# Patient Record
Sex: Female | Born: 1950 | Race: White | Hispanic: No | Marital: Married | Smoking: Never smoker
Health system: Southern US, Community
[De-identification: ages and names within clinical notes are randomized; demographics above are authoritative.]

## PROBLEM LIST (undated history)

## (undated) DIAGNOSIS — F32A Depression, unspecified: Secondary | ICD-10-CM

## (undated) DIAGNOSIS — Z9889 Other specified postprocedural states: Secondary | ICD-10-CM

## (undated) DIAGNOSIS — M533 Sacrococcygeal disorders, not elsewhere classified: Secondary | ICD-10-CM

## (undated) DIAGNOSIS — F419 Anxiety disorder, unspecified: Secondary | ICD-10-CM

## (undated) DIAGNOSIS — R112 Nausea with vomiting, unspecified: Secondary | ICD-10-CM

## (undated) DIAGNOSIS — M797 Fibromyalgia: Secondary | ICD-10-CM

## (undated) DIAGNOSIS — F329 Major depressive disorder, single episode, unspecified: Secondary | ICD-10-CM

## (undated) DIAGNOSIS — K5792 Diverticulitis of intestine, part unspecified, without perforation or abscess without bleeding: Secondary | ICD-10-CM

## (undated) DIAGNOSIS — M1990 Unspecified osteoarthritis, unspecified site: Secondary | ICD-10-CM

## (undated) DIAGNOSIS — J189 Pneumonia, unspecified organism: Secondary | ICD-10-CM

## (undated) DIAGNOSIS — K219 Gastro-esophageal reflux disease without esophagitis: Secondary | ICD-10-CM

## (undated) DIAGNOSIS — D649 Anemia, unspecified: Secondary | ICD-10-CM

## (undated) DIAGNOSIS — J45909 Unspecified asthma, uncomplicated: Secondary | ICD-10-CM

## (undated) HISTORY — PX: TONSILLECTOMY: SUR1361

## (undated) HISTORY — PX: FRACTURE SURGERY: SHX138

## (undated) HISTORY — PX: ESOPHAGOGASTRODUODENOSCOPY: SHX1529

## (undated) HISTORY — PX: ABDOMINAL HYSTERECTOMY: SHX81

## (undated) HISTORY — PX: COLONOSCOPY: SHX174

## (undated) HISTORY — PX: BREAST SURGERY: SHX581

---

## 2015-01-09 ENCOUNTER — Other Ambulatory Visit: Payer: Self-pay | Admitting: Orthopedic Surgery

## 2015-01-17 ENCOUNTER — Other Ambulatory Visit (HOSPITAL_COMMUNITY): Payer: Self-pay

## 2015-01-18 ENCOUNTER — Encounter (HOSPITAL_COMMUNITY): Payer: Self-pay | Admitting: *Deleted

## 2015-01-18 NOTE — Progress Notes (Signed)
Pt denies SOB, chest pain, and being under the care of a cardiologist. Pt denies having a stress test, echo and cardiac cath. Pt denies having an EKG within the last year but stated that PCP, Dr[facility]Ted Nifong of [facility] [place],  performed a chest x ray; records requested. Pt made aware to stop Asprin, NSAID's, otc vitamins, fish oil and herbal medications. Pt verbalized understanding of all pre-op instructions.

## 2015-01-19 ENCOUNTER — Encounter (HOSPITAL_COMMUNITY): Payer: Self-pay | Admitting: *Deleted

## 2015-01-19 ENCOUNTER — Encounter (HOSPITAL_COMMUNITY)
Admission: RE | Disposition: A | Discharge status: Home / Self Care | Payer: Self-pay | Source: Ambulatory Visit | Attending: Orthopedic Surgery

## 2015-01-19 ENCOUNTER — Ambulatory Visit (HOSPITAL_COMMUNITY): Payer: Medicare HMO

## 2015-01-19 ENCOUNTER — Ambulatory Visit (HOSPITAL_COMMUNITY)
Admission: RE | Admit: 2015-01-19 | Discharge: 2015-01-19 | Disposition: A | Discharge status: Home / Self Care | Payer: Medicare HMO | Source: Ambulatory Visit | Attending: Orthopedic Surgery | Admitting: Orthopedic Surgery

## 2015-01-19 ENCOUNTER — Ambulatory Visit (HOSPITAL_COMMUNITY): Payer: Medicare HMO | Admitting: Anesthesiology

## 2015-01-19 DIAGNOSIS — F329 Major depressive disorder, single episode, unspecified: Secondary | ICD-10-CM | POA: Insufficient documentation

## 2015-01-19 DIAGNOSIS — M1990 Unspecified osteoarthritis, unspecified site: Secondary | ICD-10-CM | POA: Diagnosis not present

## 2015-01-19 DIAGNOSIS — Z01818 Encounter for other preprocedural examination: Secondary | ICD-10-CM

## 2015-01-19 DIAGNOSIS — M545 Low back pain: Secondary | ICD-10-CM | POA: Diagnosis present

## 2015-01-19 DIAGNOSIS — F413 Other mixed anxiety disorders: Secondary | ICD-10-CM | POA: Insufficient documentation

## 2015-01-19 DIAGNOSIS — Z419 Encounter for procedure for purposes other than remedying health state, unspecified: Secondary | ICD-10-CM

## 2015-01-19 DIAGNOSIS — M797 Fibromyalgia: Secondary | ICD-10-CM | POA: Insufficient documentation

## 2015-01-19 DIAGNOSIS — J45909 Unspecified asthma, uncomplicated: Secondary | ICD-10-CM | POA: Insufficient documentation

## 2015-01-19 HISTORY — DX: Anxiety disorder, unspecified: F41.9

## 2015-01-19 HISTORY — DX: Sacrococcygeal disorders, not elsewhere classified: M53.3

## 2015-01-19 HISTORY — DX: Diverticulitis of intestine, part unspecified, without perforation or abscess without bleeding: K57.92

## 2015-01-19 HISTORY — DX: Unspecified asthma, uncomplicated: J45.909

## 2015-01-19 HISTORY — DX: Unspecified osteoarthritis, unspecified site: M19.90

## 2015-01-19 HISTORY — DX: Major depressive disorder, single episode, unspecified: F32.9

## 2015-01-19 HISTORY — DX: Depression, unspecified: F32.A

## 2015-01-19 HISTORY — DX: Gastro-esophageal reflux disease without esophagitis: K21.9

## 2015-01-19 HISTORY — PX: SACROILIAC JOINT FUSION: SHX6088

## 2015-01-19 HISTORY — DX: Anemia, unspecified: D64.9

## 2015-01-19 HISTORY — DX: Other specified postprocedural states: Z98.890

## 2015-01-19 HISTORY — DX: Fibromyalgia: M79.7

## 2015-01-19 HISTORY — DX: Nausea with vomiting, unspecified: R11.2

## 2015-01-19 HISTORY — DX: Pneumonia, unspecified organism: J18.9

## 2015-01-19 LAB — CBC WITH DIFFERENTIAL/PLATELET
Basophils Absolute: 0 10*3/uL (ref 0.0–0.1)
Basophils Relative: 1 %
EOS ABS: 0.2 10*3/uL (ref 0.0–0.7)
EOS PCT: 2 %
HCT: 42 % (ref 36.0–46.0)
HEMOGLOBIN: 13.5 g/dL (ref 12.0–15.0)
LYMPHS ABS: 1.7 10*3/uL (ref 0.7–4.0)
Lymphocytes Relative: 24 %
MCH: 29.9 pg (ref 26.0–34.0)
MCHC: 32.1 g/dL (ref 30.0–36.0)
MCV: 92.9 fL (ref 78.0–100.0)
MONOS PCT: 7 %
Monocytes Absolute: 0.5 10*3/uL (ref 0.1–1.0)
NEUTROS PCT: 66 %
Neutro Abs: 4.9 10*3/uL (ref 1.7–7.7)
Platelets: 406 10*3/uL — ABNORMAL HIGH (ref 150–400)
RBC: 4.52 MIL/uL (ref 3.87–5.11)
RDW: 14.2 % (ref 11.5–15.5)
WBC: 7.3 10*3/uL (ref 4.0–10.5)

## 2015-01-19 LAB — COMPREHENSIVE METABOLIC PANEL
ALK PHOS: 80 U/L (ref 38–126)
ALT: 20 U/L (ref 14–54)
ANION GAP: 9 (ref 5–15)
AST: 20 U/L (ref 15–41)
Albumin: 4 g/dL (ref 3.5–5.0)
BUN: 9 mg/dL (ref 6–20)
CALCIUM: 9.7 mg/dL (ref 8.9–10.3)
CO2: 26 mmol/L (ref 22–32)
Chloride: 107 mmol/L (ref 101–111)
Creatinine, Ser: 0.91 mg/dL (ref 0.44–1.00)
Glucose, Bld: 106 mg/dL — ABNORMAL HIGH (ref 65–99)
Potassium: 4 mmol/L (ref 3.5–5.1)
SODIUM: 142 mmol/L (ref 135–145)
TOTAL PROTEIN: 7.3 g/dL (ref 6.5–8.1)
Total Bilirubin: 0.5 mg/dL (ref 0.3–1.2)

## 2015-01-19 LAB — APTT: aPTT: 32 seconds (ref 24–37)

## 2015-01-19 LAB — SURGICAL PCR SCREEN
MRSA, PCR: NEGATIVE
Staphylococcus aureus: POSITIVE — AB

## 2015-01-19 LAB — PROTIME-INR
INR: 1.11 (ref 0.00–1.49)
PROTHROMBIN TIME: 14.5 s (ref 11.6–15.2)

## 2015-01-19 SURGERY — SACROILIAC JOINT FUSION
Anesthesia: General | Site: Pelvis | Laterality: Left

## 2015-01-19 MED ORDER — BUPIVACAINE-EPINEPHRINE (PF) 0.25% -1:200000 IJ SOLN
INTRAMUSCULAR | Status: DC | PRN
  Administered 2015-01-19: 10 mL

## 2015-01-19 MED ORDER — CEFAZOLIN SODIUM-DEXTROSE 2-3 GM-% IV SOLR
2.0000 g | INTRAVENOUS | Status: AC
  Administered 2015-01-19: 2 g via INTRAVENOUS
  Filled 2015-01-19: qty 50

## 2015-01-19 MED ORDER — MIDAZOLAM HCL 5 MG/5ML IJ SOLN
INTRAMUSCULAR | Status: DC | PRN
  Administered 2015-01-19: 2 mg via INTRAVENOUS

## 2015-01-19 MED ORDER — PROPOFOL 10 MG/ML IV BOLUS
INTRAVENOUS | Status: DC | PRN
  Administered 2015-01-19: 150 mg via INTRAVENOUS

## 2015-01-19 MED ORDER — METHOCARBAMOL 1000 MG/10ML IJ SOLN
500.0000 mg | Freq: Three times a day (TID) | INTRAMUSCULAR | Status: DC
Start: 2015-01-19 — End: 2015-01-19
  Filled 2015-01-19: qty 5

## 2015-01-19 MED ORDER — DIAZEPAM 5 MG PO TABS
ORAL_TABLET | ORAL | Status: AC
  Filled 2015-01-19: qty 1

## 2015-01-19 MED ORDER — ONDANSETRON HCL 4 MG/2ML IJ SOLN
INTRAMUSCULAR | Status: DC | PRN
  Administered 2015-01-19: 4 mg via INTRAVENOUS

## 2015-01-19 MED ORDER — LACTATED RINGERS IV SOLN
INTRAVENOUS | Status: DC | PRN
  Administered 2015-01-19 (×2): via INTRAVENOUS

## 2015-01-19 MED ORDER — ROCURONIUM BROMIDE 50 MG/5ML IV SOLN
INTRAVENOUS | Status: AC
  Filled 2015-01-19: qty 1

## 2015-01-19 MED ORDER — HYDROMORPHONE HCL 1 MG/ML IJ SOLN
0.2500 mg | INTRAMUSCULAR | Status: DC | PRN
  Administered 2015-01-19 (×4): 0.5 mg via INTRAVENOUS

## 2015-01-19 MED ORDER — MUPIROCIN 2 % EX OINT
1.0000 "application " | TOPICAL_OINTMENT | Freq: Once | CUTANEOUS | Status: AC
  Administered 2015-01-19: 1 via TOPICAL
  Filled 2015-01-19: qty 22

## 2015-01-19 MED ORDER — FENTANYL CITRATE (PF) 100 MCG/2ML IJ SOLN
INTRAMUSCULAR | Status: AC
  Administered 2015-01-19: 100 ug via INTRAVENOUS
  Filled 2015-01-19: qty 2

## 2015-01-19 MED ORDER — 0.9 % SODIUM CHLORIDE (POUR BTL) OPTIME
TOPICAL | Status: DC | PRN
  Administered 2015-01-19: 1000 mL

## 2015-01-19 MED ORDER — FENTANYL CITRATE (PF) 250 MCG/5ML IJ SOLN
INTRAMUSCULAR | Status: AC
  Filled 2015-01-19: qty 5

## 2015-01-19 MED ORDER — EPHEDRINE SULFATE 50 MG/ML IJ SOLN
INTRAMUSCULAR | Status: AC
  Filled 2015-01-19: qty 1

## 2015-01-19 MED ORDER — HYDROMORPHONE HCL 1 MG/ML IJ SOLN
INTRAMUSCULAR | Status: AC
  Filled 2015-01-19: qty 1

## 2015-01-19 MED ORDER — LACTATED RINGERS IV SOLN
INTRAVENOUS | Status: DC
  Administered 2015-01-19: 11:00:00 via INTRAVENOUS

## 2015-01-19 MED ORDER — BUPIVACAINE-EPINEPHRINE (PF) 0.25% -1:200000 IJ SOLN
INTRAMUSCULAR | Status: AC
  Filled 2015-01-19: qty 30

## 2015-01-19 MED ORDER — MIDAZOLAM HCL 2 MG/2ML IJ SOLN
INTRAMUSCULAR | Status: AC
Start: 2015-01-19 — End: 2015-01-19
  Filled 2015-01-19: qty 2

## 2015-01-19 MED ORDER — LIDOCAINE HCL (CARDIAC) 20 MG/ML IV SOLN
INTRAVENOUS | Status: AC
  Filled 2015-01-19: qty 5

## 2015-01-19 MED ORDER — FENTANYL CITRATE (PF) 100 MCG/2ML IJ SOLN
INTRAMUSCULAR | Status: DC | PRN
  Administered 2015-01-19 (×2): 50 ug via INTRAVENOUS
  Administered 2015-01-19: 100 ug via INTRAVENOUS
  Administered 2015-01-19: 50 ug via INTRAVENOUS

## 2015-01-19 MED ORDER — POVIDONE-IODINE 7.5 % EX SOLN
Freq: Once | CUTANEOUS | Status: DC
  Filled 2015-01-19: qty 118

## 2015-01-19 MED ORDER — ROCURONIUM BROMIDE 100 MG/10ML IV SOLN
INTRAVENOUS | Status: DC | PRN
  Administered 2015-01-19: 50 mg via INTRAVENOUS

## 2015-01-19 MED ORDER — STERILE WATER FOR INJECTION IJ SOLN
INTRAMUSCULAR | Status: AC
  Filled 2015-01-19: qty 10

## 2015-01-19 MED ORDER — FENTANYL CITRATE (PF) 100 MCG/2ML IJ SOLN
100.0000 ug | Freq: Once | INTRAMUSCULAR | Status: AC
  Administered 2015-01-19: 100 ug via INTRAVENOUS

## 2015-01-19 MED ORDER — METHOCARBAMOL 1000 MG/10ML IJ SOLN
500.0000 mg | INTRAVENOUS | Status: AC
  Administered 2015-01-19: 500 mg via INTRAVENOUS
  Filled 2015-01-19: qty 5

## 2015-01-19 MED ORDER — GLYCOPYRROLATE 0.2 MG/ML IJ SOLN
INTRAMUSCULAR | Status: AC
  Filled 2015-01-19: qty 1

## 2015-01-19 MED ORDER — LIDOCAINE HCL (CARDIAC) 20 MG/ML IV SOLN
INTRAVENOUS | Status: DC | PRN
  Administered 2015-01-19: 60 mg via INTRAVENOUS

## 2015-01-19 MED ORDER — PHENYLEPHRINE 40 MCG/ML (10ML) SYRINGE FOR IV PUSH (FOR BLOOD PRESSURE SUPPORT)
PREFILLED_SYRINGE | INTRAVENOUS | Status: AC
  Filled 2015-01-19: qty 10

## 2015-01-19 MED ORDER — PHENYLEPHRINE HCL 10 MG/ML IJ SOLN
INTRAMUSCULAR | Status: DC | PRN
  Administered 2015-01-19 (×2): 80 ug via INTRAVENOUS

## 2015-01-19 MED ORDER — SUGAMMADEX SODIUM 200 MG/2ML IV SOLN
INTRAVENOUS | Status: DC | PRN
  Administered 2015-01-19: 214 mg via INTRAVENOUS

## 2015-01-19 MED ORDER — SUGAMMADEX SODIUM 500 MG/5ML IV SOLN
INTRAVENOUS | Status: AC
  Filled 2015-01-19: qty 5

## 2015-01-19 MED ORDER — DIAZEPAM 5 MG PO TABS
5.0000 mg | ORAL_TABLET | Freq: Once | ORAL | Status: AC
  Administered 2015-01-19: 5 mg via ORAL

## 2015-01-19 SURGICAL SUPPLY — 54 items
BENZOIN TINCTURE PRP APPL 2/3 (GAUZE/BANDAGES/DRESSINGS) ×3 IMPLANT
BLADE SURG 10 STRL SS (BLADE) ×3 IMPLANT
BLADE SURG 11 STRL SS (BLADE) ×3 IMPLANT
BLADE SURG ROTATE 9660 (MISCELLANEOUS) IMPLANT
CANISTER SUCTION 2500CC (MISCELLANEOUS) ×3 IMPLANT
CAP-I-FUSE IMPLANT SYSTEM ×3 IMPLANT
CLOSURE WOUND 1/2 X4 (GAUZE/BANDAGES/DRESSINGS) ×1
COVER SURGICAL LIGHT HANDLE (MISCELLANEOUS) ×3 IMPLANT
DRAPE C-ARM 42X72 X-RAY (DRAPES) ×3 IMPLANT
DRAPE C-ARMOR (DRAPES) ×3 IMPLANT
DRAPE INCISE IOBAN 66X45 STRL (DRAPES) ×3 IMPLANT
DRAPE POUCH INSTRU U-SHP 10X18 (DRAPES) ×3 IMPLANT
DRAPE SURG 17X23 STRL (DRAPES) ×12 IMPLANT
DURAPREP 26ML APPLICATOR (WOUND CARE) ×3 IMPLANT
ELECT CAUTERY BLADE 6.4 (BLADE) ×3 IMPLANT
ELECT REM PT RETURN 9FT ADLT (ELECTROSURGICAL) ×3
ELECTRODE REM PT RTRN 9FT ADLT (ELECTROSURGICAL) ×1 IMPLANT
GAUZE SPONGE 4X4 12PLY STRL (GAUZE/BANDAGES/DRESSINGS) ×3 IMPLANT
GAUZE SPONGE 4X4 16PLY XRAY LF (GAUZE/BANDAGES/DRESSINGS) ×3 IMPLANT
GLOVE BIO SURGEON STRL SZ7 (GLOVE) ×3 IMPLANT
GLOVE BIO SURGEON STRL SZ8 (GLOVE) ×3 IMPLANT
GLOVE BIOGEL PI IND STRL 7.0 (GLOVE) ×1 IMPLANT
GLOVE BIOGEL PI IND STRL 8 (GLOVE) ×1 IMPLANT
GLOVE BIOGEL PI INDICATOR 7.0 (GLOVE) ×2
GLOVE BIOGEL PI INDICATOR 8 (GLOVE) ×2
GOWN STRL REUS W/ TWL LRG LVL3 (GOWN DISPOSABLE) ×2 IMPLANT
GOWN STRL REUS W/ TWL XL LVL3 (GOWN DISPOSABLE) ×1 IMPLANT
GOWN STRL REUS W/TWL LRG LVL3 (GOWN DISPOSABLE) ×4
GOWN STRL REUS W/TWL XL LVL3 (GOWN DISPOSABLE) ×2
KIT BASIN OR (CUSTOM PROCEDURE TRAY) ×3 IMPLANT
KIT ROOM TURNOVER OR (KITS) ×3 IMPLANT
MANIFOLD NEPTUNE II (INSTRUMENTS) IMPLANT
NEEDLE 22X1 1/2 (OR ONLY) (NEEDLE) ×3 IMPLANT
NEEDLE HYPO 25GX1X1/2 BEV (NEEDLE) ×3 IMPLANT
NS IRRIG 1000ML POUR BTL (IV SOLUTION) ×3 IMPLANT
PACK UNIVERSAL I (CUSTOM PROCEDURE TRAY) ×6 IMPLANT
PAD ARMBOARD 7.5X6 YLW CONV (MISCELLANEOUS) ×9 IMPLANT
PENCIL BUTTON HOLSTER BLD 10FT (ELECTRODE) ×3 IMPLANT
SPONGE LAP 18X18 X RAY DECT (DISPOSABLE) ×3 IMPLANT
STAPLER VISISTAT 35W (STAPLE) ×3 IMPLANT
STRIP CLOSURE SKIN 1/2X4 (GAUZE/BANDAGES/DRESSINGS) ×2 IMPLANT
SUT MNCRL AB 4-0 PS2 18 (SUTURE) ×3 IMPLANT
SUT VIC AB 0 CT1 18XCR BRD 8 (SUTURE) ×1 IMPLANT
SUT VIC AB 0 CT1 8-18 (SUTURE) ×2
SUT VIC AB 1 CT1 18XCR BRD 8 (SUTURE) ×1 IMPLANT
SUT VIC AB 1 CT1 8-18 (SUTURE) ×2
SUT VIC AB 2-0 CT2 18 VCP726D (SUTURE) ×3 IMPLANT
SYR BULB IRRIGATION 50ML (SYRINGE) ×6 IMPLANT
SYR CONTROL 10ML LL (SYRINGE) ×3 IMPLANT
TOWEL OR 17X24 6PK STRL BLUE (TOWEL DISPOSABLE) IMPLANT
TOWEL OR 17X26 10 PK STRL BLUE (TOWEL DISPOSABLE) ×6 IMPLANT
TUBE CONNECTING 12'X1/4 (SUCTIONS) ×1
TUBE CONNECTING 12X1/4 (SUCTIONS) ×2 IMPLANT
YANKAUER SUCT BULB TIP NO VENT (SUCTIONS) ×3 IMPLANT

## 2015-01-19 NOTE — Op Note (Signed)
NAMWilla Graham:  [place]           ACCOUNT NO.:  [id number]  MEDICAL RECORD NO.:  [id number]  LOCATION:  [facility]                         FACILITY:  [facility]  PHYSICIAN:  Estill BambergMark Reathel Turi, MD      DATE OF BIRTH:  1950/10/27  DATE OF PROCEDURE:  01/19/2015 DATE OF DISCHARGE:  01/19/2015                              OPERATIVE REPORT   PREOPERATIVE DIAGNOSIS:  Left-sided sacroiliac joint dysfunction.  POSTOPERATIVE DIAGNOSIS:  Left-sided sacroiliac joint dysfunction.  PROCEDURE:  Left-sided sacroiliac joint fusion using iFuse instrumentation system.  SURGEON:  Estill BambergMark Verdean Murin, MD.  ASSISTANJason Coop:  Kayla McKenzie, PA-C.  ANESTHESIA:  General endotracheal anesthesia.  COMPLICATIONS:  None.  DISPOSITION:  Stable.  ESTIMATED BLOOD LOSS:  Minimal.  INDICATIONS FOR SURGERY:  Briefly, Ms. Maravilla is a very pleasant 89- year-old female, who did present to me with ongoing and debilitating pain at the left side of her low back.  The patient did fail appropriate forms of conservative care as outlined in my preoperative note.  The patient however did continue to have ongoing pain in the left low back consistent with left sacroiliac joint dysfunction.  Therefore, we did discuss proceeding with the procedure reflected above.  The patient was fully aware of the risks and limitations of the procedure, and she did elect to proceed.  OPERATIVE DETAILS:  On January 19, 2015, the patient was brought to Surgery and general endotracheal anesthesia was administered.  The patient was placed into the prone position.  Gel rolls were placed under the patient's chest and hips.  Antibiotics were given and the left buttock was prepped and draped in the usual fashion.  An incision was then made overlying the left sacroiliac joint liberally using inlet, outlet, and lateral fluoroscopy, I did advance 3 guidewires across the left SI joint.  One was immediately above the S1 foramen, one was beneath it, and the third  was in line with the S2 foramen.  I then drilled and broached over the guidewires.  I then advanced 7 mm implants of the appropriate length across the guidewires, again, liberally using inlet, outlet, and lateral fluoroscopic images.  I was very pleased with the final resting position of the implants.  The guidewires were then removed.  I then copiously irrigated the wound with approximately 500 mL of normal saline.  The wound was then closed in layers using #1 Vicryl followed by 0-Vicryl, followed by 2-0 Vicryl, followed by 3-0 Monocryl. Benzoin and Steri-Strips were applied, followed by sterile dressing. All instrument counts were correct at the termination of the procedure.  Of note, Jason CoopKayla McKenzie was my assistant throughout surgery, and did aid in retraction, suctioning, and closure from start to finish.     Estill BambergMark Patriciaann Rabanal, MD     MD/MEDQ  D:  01/19/2015  T:  01/19/2015  Job:  [id number]

## 2015-01-19 NOTE — H&P (Signed)
PREOPERATIVE H&P  Chief Complaint: Left low back pain  HPI: Katie Graham is a 64 y.o. female who presents with ongoing pain in the left low back  Patient had an appropriate response to MBBs and an RFA, but her relief was only very temporary  Patient has failed multiple forms of conservative care and continues to have pain (see office notes for additional details regarding the patient's full course of treatment)  Past Medical History  Diagnosis Date  . PONV (postoperative nausea and vomiting)     with Sodium Penothal  . Asthma   . Sacroiliac joint dysfunction of left side   . Arthritis   . Fibromyalgia   . GERD (gastroesophageal reflux disease)   . Pneumonia   . Depression   . Anxiety   . Anemia   . Diverticulitis    Past Surgical History  Procedure Laterality Date  . Fracture surgery      right shoulder  . Esophagogastroduodenoscopy    . Colonoscopy    . Tonsillectomy    . Abdominal hysterectomy    . Breast surgery      lumpectomy 1985 left breast   Social History   Social History  . Marital Status: Married    Spouse Name: N/A  . Number of Children: N/A  . Years of Education: N/A   Social History Main Topics  . Smoking status: Never Smoker   . Smokeless tobacco: Never Used  . Alcohol Use: Yes     Comment: social  . Drug Use: No  . Sexual Activity: Not Asked   Other Topics Concern  . None   Social History Narrative  . None   Family History  Problem Relation Age of Onset  . Cardiomyopathy Sister   . Cancer Sister    Allergies  Allergen Reactions  . Nsaids     GURD and diverticulitis    Prior to Admission medications   Medication Sig Start Date End Date Taking? Authorizing Provider  albuterol (PROVENTIL HFA;VENTOLIN HFA) 108 (90 BASE) MCG/ACT inhaler Inhale 1-2 puffs into the lungs every 6 (six) hours as needed for wheezing or shortness of breath.   Yes Historical Provider, MD  cyclobenzaprine (FLEXERIL) 10 MG tablet Take 10 mg by  mouth 3 (three) times daily as needed for muscle spasms.   Yes Historical Provider, MD  Fluticasone-Salmeterol (ADVAIR) 250-50 MCG/DOSE AEPB Inhale 1 puff into the lungs 2 (two) times daily.   Yes Historical Provider, MD  gabapentin (NEURONTIN) 300 MG capsule Take 300 mg by mouth 3 (three) times daily.   Yes Historical Provider, MD  oxyCODONE-acetaminophen (PERCOCET) 10-325 MG tablet Take 1 tablet by mouth every 4 (four) hours as needed for pain.   Yes Historical Provider, MD  sertraline (ZOLOFT) 100 MG tablet Take 100 mg by mouth daily.   Yes Historical Provider, MD     All other systems have been reviewed and were otherwise negative with the exception of those mentioned in the HPI and as above.  Physical Exam: There were no vitals filed for this visit.  General: Alert, no acute distress Cardiovascular: No pedal edema Respiratory: No cyanosis, no use of accessory musculature Skin: No lesions in the area of chief complaint Neurologic: Sensation intact distally Psychiatric: Patient is competent for consent with normal mood and affect Lymphatic: No axillary or cervical lymphadenopathy  MUSCULOSKELETAL: + TTP left SI joint  Assessment/Plan: Left sided sacroiliac joint dysfunction Plan for Procedure(s):  LEFT SIDED SACROILIAC JOINT FUSION    Katryna Tschirhart  Darcel Bayley, MD 01/19/2015 6:59 AM

## 2015-01-19 NOTE — Transfer of Care (Signed)
Immediate Anesthesia Transfer of Care Note  Patient: Katie FraterBeverly Grinage  Procedure(s) Performed: Procedure(s):  LEFT SIDED SACROILIAC JOINT FUSION  (Left)  Patient Location: PACU  Anesthesia Type:General  Level of Consciousness: awake and alert   Airway & Oxygen Therapy: Patient Spontanous Breathing and Patient connected to nasal cannula oxygen  Post-op Assessment: Report given to RN and Post -op Vital signs reviewed and stable  Post vital signs: Reviewed and stable  Last Vitals:  Filed Vitals:   01/19/15 0949  BP: 172/97  Pulse: 88  Temp: 36.8 C  Resp: 18    Complications: No apparent anesthesia complications

## 2015-01-19 NOTE — Anesthesia Procedure Notes (Signed)
Procedure Name: Intubation Date/Time: 01/19/2015 1:02 PM Performed by: Eligha Bridegroom Pre-anesthesia Checklist: Patient identified, Patient being monitored, Emergency Drugs available, Timeout performed and Suction available Patient Re-evaluated:Patient Re-evaluated prior to inductionOxygen Delivery Method: Circle system utilized Preoxygenation: Pre-oxygenation with 100% oxygen Intubation Type: IV induction Ventilation: Mask ventilation without difficulty and Oral airway inserted - appropriate to patient size Grade View: Grade II Tube type: Oral Tube size: 7.0 mm Number of attempts: 1 Airway Equipment and Method: Stylet,  LTA kit utilized and Oral airway Secured at: 22 cm Tube secured with: Tape Dental Injury: Teeth and Oropharynx as per pre-operative assessment

## 2015-01-19 NOTE — Anesthesia Preprocedure Evaluation (Addendum)
Anesthesia Evaluation  Patient identified by MRN, date of birth, ID band Patient awake    Reviewed: Allergy & Precautions, H&P , NPO status , Patient's Chart, lab work & pertinent test results  History of Anesthesia Complications (+) PONV  Airway Mallampati: III  TM Distance: >3 FB Neck ROM: Full    Dental no notable dental hx. (+) Teeth Intact, Dental Advisory Given   Pulmonary asthma ,    Pulmonary exam normal breath sounds clear to auscultation       Cardiovascular negative cardio ROS   Rhythm:Regular Rate:Normal     Neuro/Psych Anxiety Depression negative neurological ROS     GI/Hepatic Neg liver ROS, GERD  Controlled,  Endo/Other  Morbid obesity  Renal/GU negative Renal ROS  negative genitourinary   Musculoskeletal  (+) Arthritis , Osteoarthritis,  Fibromyalgia -  Abdominal   Peds  Hematology negative hematology ROS (+)   Anesthesia Other Findings   Reproductive/Obstetrics negative OB ROS                           Anesthesia Physical Anesthesia Plan  ASA: II  Anesthesia Plan: General   Post-op Pain Management:    Induction: Intravenous  Airway Management Planned: Oral ETT  Additional Equipment:   Intra-op Plan:   Post-operative Plan: Extubation in OR  Informed Consent: I have reviewed the patients History and Physical, chart, labs and discussed the procedure including the risks, benefits and alternatives for the proposed anesthesia with the patient or authorized representative who has indicated his/her understanding and acceptance.   Dental advisory given  Plan Discussed with: CRNA  Anesthesia Plan Comments:         Anesthesia Quick Evaluation

## 2015-01-20 ENCOUNTER — Encounter (HOSPITAL_COMMUNITY): Payer: Self-pay | Admitting: Orthopedic Surgery

## 2015-01-20 NOTE — Anesthesia Postprocedure Evaluation (Signed)
  Anesthesia Post-op Note  Patient: Katie Graham  Procedure(s) Performed: Procedure(s):  LEFT SIDED SACROILIAC JOINT FUSION  (Left)  Patient Location: PACU  Anesthesia Type:General  Level of Consciousness: awake and alert   Airway and Oxygen Therapy: Patient Spontanous Breathing  Post-op Pain: Controlled  Post-op Assessment: Post-op Vital signs reviewed, Patient's Cardiovascular Status Stable and Respiratory Function Stable  Post-op Vital Signs: Reviewed  Filed Vitals:   01/19/15 1615  BP:   Pulse: 90  Temp:   Resp: 8    Complications: No apparent anesthesia complications

## 2016-06-19 IMAGING — RF DG SI JOINTS 3+V
1 series · 3 of 3 positions shown · non-contrast
Comparison: None.

CLINICAL DATA: Left SI joint fusion.

EXAM:
BILATERAL SACROILIAC JOINTS - 3+ VIEW; DG C-ARM 61-120 MIN

[Series 1: run · 3 of 3 slices shown]
[im 1/3]
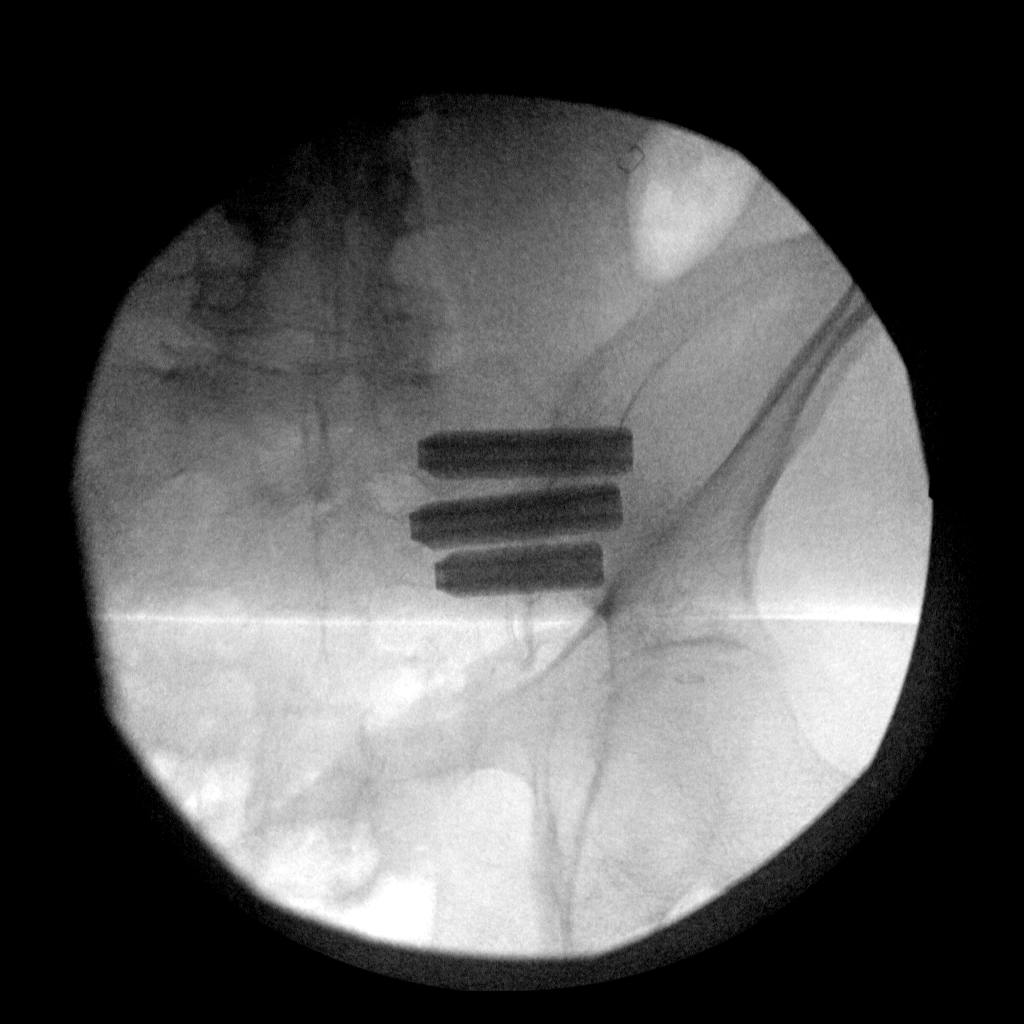
[im 2/3]
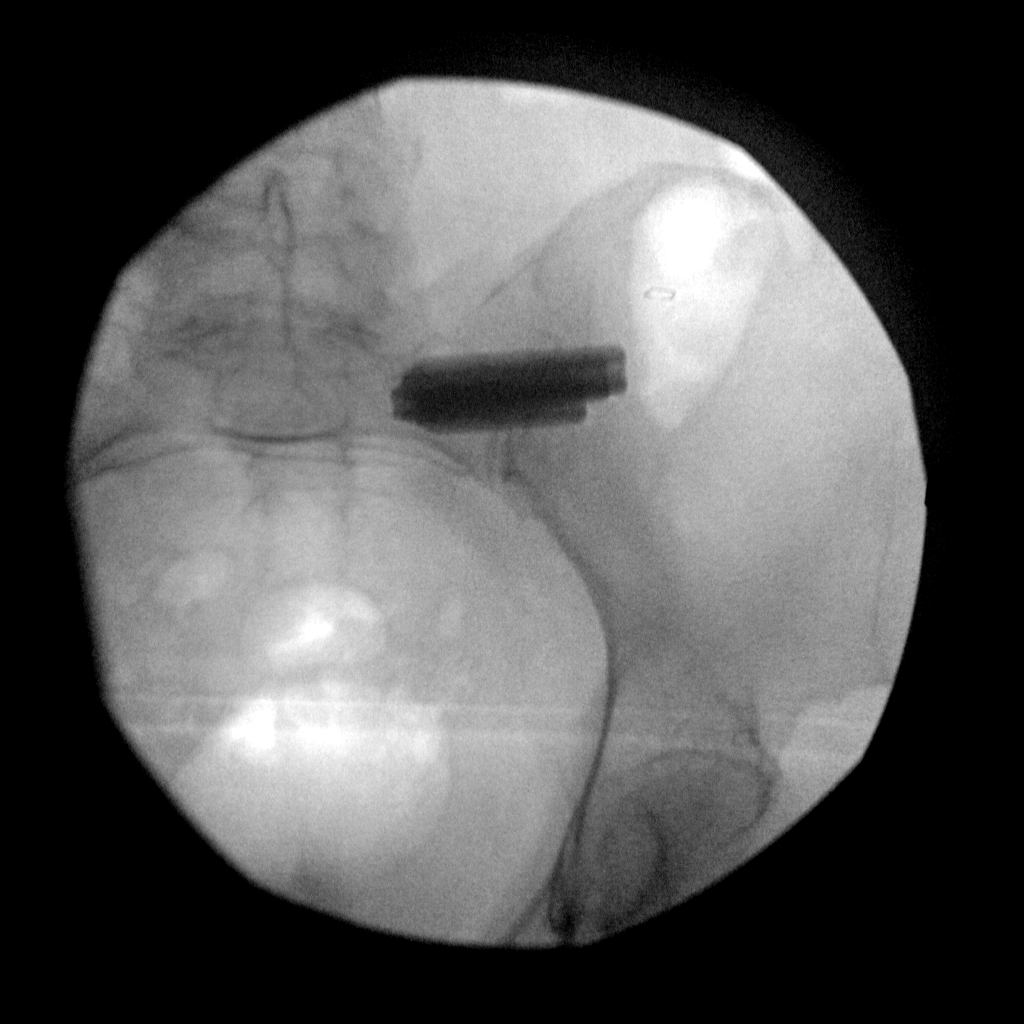
[im 3/3]
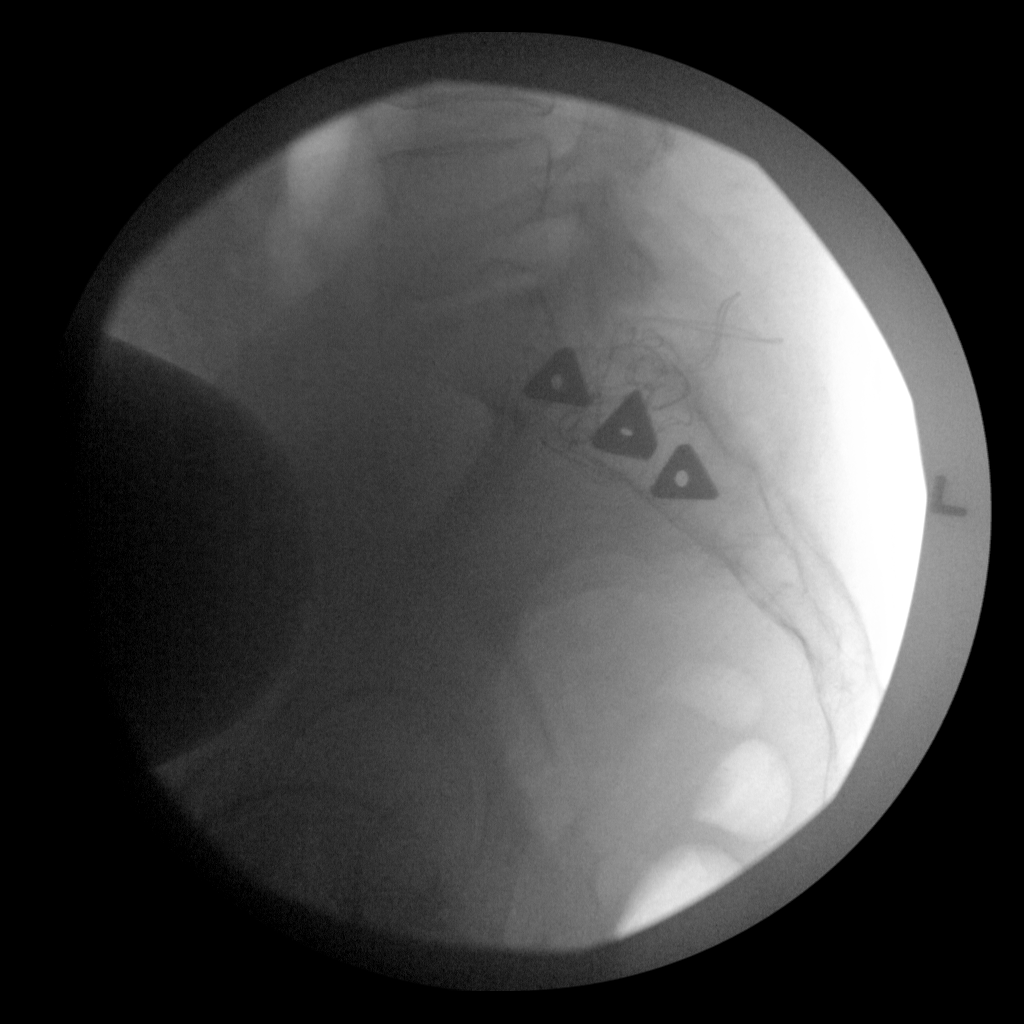

[3 of 3 positions shown; findings below may reference images not displayed]

FINDINGS: Fluoroscopic spot images demonstrate 3 fusing metallic rods across
the left SI joint. No complicating features are demonstrated.
IMPRESSION: Left SI joint fusion hardware in good position without complicating
features.
